# Patient Record
Sex: Male | Born: 1985 | Race: Black or African American | Hispanic: No | Marital: Single | Smoking: Current every day smoker
Health system: Southern US, Community
[De-identification: ages and names within clinical notes are randomized; demographics above are authoritative.]

## PROBLEM LIST (undated history)

## (undated) DIAGNOSIS — J45909 Unspecified asthma, uncomplicated: Secondary | ICD-10-CM

## (undated) HISTORY — PX: SKIN GRAFT: SHX250

---

## 2016-04-10 ENCOUNTER — Inpatient Hospital Stay
Admit: 2016-04-10 | Discharge: 2016-04-10 | Disposition: A | Discharge status: Home / Self Care | Payer: Self-pay | Attending: Emergency Medicine

## 2016-04-10 DIAGNOSIS — S0181XA Laceration without foreign body of other part of head, initial encounter: Secondary | ICD-10-CM

## 2016-04-10 MED ORDER — BACITRACIN 500 UNIT/G TOPICAL PACKET
500 unit/gram | Freq: Once | CUTANEOUS | Status: AC
Start: 2016-04-10 — End: 2016-04-10
  Administered 2016-04-10: 07:00:00 via TOPICAL

## 2016-04-10 MED ORDER — ACETAMINOPHEN 500 MG TAB
500 mg | ORAL_TABLET | Freq: Four times a day (QID) | ORAL | 0 refills | Status: AC | PRN
Start: 2016-04-10 — End: ?

## 2016-04-10 MED ORDER — ACETAMINOPHEN 500 MG TAB
500 mg | ORAL | Status: AC
Start: 2016-04-10 — End: 2016-04-10
  Administered 2016-04-10: 07:00:00 via ORAL

## 2016-04-10 MED ORDER — BACITRACIN 500 UNIT/G OINTMENT
500 unit/gram | Freq: Three times a day (TID) | CUTANEOUS | 0 refills | Status: AC
Start: 2016-04-10 — End: 2016-04-20

## 2016-04-10 MED FILL — TYLENOL EXTRA STRENGTH 500 MG TABLET: 500 mg | ORAL | Qty: 2

## 2016-04-10 MED FILL — BACITRACIN 500 UNIT/G TOPICAL PACKET: 500 unit/gram | CUTANEOUS | Qty: 1

## 2016-04-10 NOTE — ED Notes (Signed)
Patient reports obtaining a head laceration to his right eyebrow by opening a door outwardly while in the dark, bleeding is controlled at this time. 4x4 gauze placed on wound

## 2016-04-10 NOTE — ED Provider Notes (Addendum)
HPI Comments: Patient presents after sustaining a facial injury.  He was running up the stairs, mis-estimated and struck his face on the door.  Denies any loss consciousness does not take any blood thinners, no alcohol but does smoke marijuana.  Denies any visual disturbances reports that his visual acuity is 20/20.  He denies any diplopia.  No nasal pain no dental pain      Patient is a 30 y.o. male presenting with skin laceration.   Laceration           History reviewed. No pertinent past medical history.    History reviewed. No pertinent surgical history.      History reviewed. No pertinent family history.    Social History     Social History   ??? Marital status: N/A     Spouse name: N/A   ??? Number of children: N/A   ??? Years of education: N/A     Occupational History   ??? Not on file.     Social History Main Topics   ??? Smoking status: Current Every Day Smoker   ??? Smokeless tobacco: Not on file   ??? Alcohol use 4.2 oz/week     7 Cans of beer per week   ??? Drug use: Yes     Special: Marijuana   ??? Sexual activity: Not on file     Other Topics Concern   ??? Not on file     Social History Narrative   ??? No narrative on file         ALLERGIES: Review of patient's allergies indicates no known allergies.    Review of Systems   Constitutional: Negative for fever.   Skin: Positive for wound.       Vitals:    04/10/16 0212   BP: (!) 136/93   Pulse: 77   Resp: 20   Temp: 97.5 ??F (36.4 ??C)   SpO2: 98%   Weight: 74.8 kg (165 lb)   Height:  (1.854 m)            Physical Exam   Constitutional:   General:  Well-developed, well-nourished, no apparent distress.    Head: Normocephalic, 2 cm laceration vertical going down to the RIGHT eyebrow, no depressed skull fracture.    Eyes:  Pupils 4 mm  extraocular movements intact.  No pallor or conjunctival injection.    Nose:  No rhinorrhea, inspection grossly normal.    Ears:  Grossly normal to inspection, no discharge.    Mouth:  Mucous membranes moist, no appreciable intraoral lesion.     Neck:  Trachea midline, no asymmetry.    Chest:  Grossly normal inspection, symmetric chest rise.    Extremities:  Grossly normal to inspection, peripheral pulses intact.    Neurologic:  Alert and oriented no appreciable focal neurologic deficit.    Psychiatric:  Grossly normal mood and affect.    Nursing note reviewed, vital signs reviewed.        MDM  Number of Diagnoses or Management Options  Elevated blood pressure reading:   Facial laceration, initial encounter:   Diagnosis management comments: ED course:  Patient presents with facial laceration, no concern for intracranial hemorrhage or facial fracture.  We'll perform primary repair here    Procedure note:  Laceration repair  Performed by: Myself  Preparation: Irrigation and standard sterile precautions  Location: Face  Wound length: 2 cm  Anesthesia:  Local injection of lidocaine  Foreign bodies: None  Irrigation via saline jet  lavage  Debridement: None  Skin closure: 6-0 nylon running  Number of sutures: 1  M.D. procedure time: 10 minutes    Patient's presentation, history, physical exam and laboratory evaluations were reviewed.  At this time patient was felt to be stable for outpatient management and follow with primary care/specialist.  Patient was instructed to return to the emergency department with any concerns.    Disposition:    Discharged home      Portions of this chart were created with Dragon medical speech to text program.   Unrecognized errors may be present.    ED Course       Procedures

## 2016-04-10 NOTE — ED Notes (Signed)
Discharge to home WDL. Patient armband removed and shredded

## 2018-05-11 ENCOUNTER — Emergency Department: Payer: Self-pay

## 2018-05-11 ENCOUNTER — Emergency Department
Admission: EM | Admit: 2018-05-11 | Discharge: 2018-05-12 | Disposition: A | Discharge status: Home / Self Care | Payer: Self-pay | Attending: Emergency Medicine | Admitting: Emergency Medicine

## 2018-05-11 ENCOUNTER — Encounter: Payer: Self-pay | Admitting: Emergency Medicine

## 2018-05-11 ENCOUNTER — Other Ambulatory Visit: Payer: Self-pay

## 2018-05-11 DIAGNOSIS — N50812 Left testicular pain: Secondary | ICD-10-CM

## 2018-05-11 DIAGNOSIS — R109 Unspecified abdominal pain: Secondary | ICD-10-CM

## 2018-05-11 DIAGNOSIS — J45909 Unspecified asthma, uncomplicated: Secondary | ICD-10-CM | POA: Insufficient documentation

## 2018-05-11 DIAGNOSIS — N453 Epididymo-orchitis: Secondary | ICD-10-CM | POA: Insufficient documentation

## 2018-05-11 DIAGNOSIS — R1032 Left lower quadrant pain: Secondary | ICD-10-CM | POA: Insufficient documentation

## 2018-05-11 DIAGNOSIS — N5089 Other specified disorders of the male genital organs: Secondary | ICD-10-CM

## 2018-05-11 DIAGNOSIS — F17200 Nicotine dependence, unspecified, uncomplicated: Secondary | ICD-10-CM | POA: Insufficient documentation

## 2018-05-11 HISTORY — DX: Unspecified asthma, uncomplicated: J45.909

## 2018-05-11 LAB — URINALYSIS, COMPLETE (UACMP) WITH MICROSCOPIC
BACTERIA UA: NONE SEEN
BILIRUBIN URINE: NEGATIVE
Glucose, UA: NEGATIVE mg/dL
KETONES UR: NEGATIVE mg/dL
Nitrite: NEGATIVE
PH: 5 (ref 5.0–8.0)
PROTEIN: 100 mg/dL — AB
Specific Gravity, Urine: 1.025 (ref 1.005–1.030)

## 2018-05-11 LAB — COMPREHENSIVE METABOLIC PANEL
ALT: 9 U/L — AB (ref 17–63)
AST: 14 U/L — ABNORMAL LOW (ref 15–41)
Albumin: 4.2 g/dL (ref 3.5–5.0)
Alkaline Phosphatase: 91 U/L (ref 38–126)
Anion gap: 9 (ref 5–15)
BUN: 7 mg/dL (ref 6–20)
CHLORIDE: 101 mmol/L (ref 101–111)
CO2: 26 mmol/L (ref 22–32)
CREATININE: 0.91 mg/dL (ref 0.61–1.24)
Calcium: 9.3 mg/dL (ref 8.9–10.3)
Glucose, Bld: 123 mg/dL — ABNORMAL HIGH (ref 65–99)
POTASSIUM: 4.2 mmol/L (ref 3.5–5.1)
SODIUM: 136 mmol/L (ref 135–145)
Total Bilirubin: 0.6 mg/dL (ref 0.3–1.2)
Total Protein: 8.4 g/dL — ABNORMAL HIGH (ref 6.5–8.1)

## 2018-05-11 LAB — CBC
HCT: 43.8 % (ref 40.0–52.0)
HEMOGLOBIN: 14.8 g/dL (ref 13.0–18.0)
MCH: 29 pg (ref 26.0–34.0)
MCHC: 33.9 g/dL (ref 32.0–36.0)
MCV: 85.6 fL (ref 80.0–100.0)
Platelets: 188 10*3/uL (ref 150–440)
RBC: 5.11 MIL/uL (ref 4.40–5.90)
RDW: 15.4 % — ABNORMAL HIGH (ref 11.5–14.5)
WBC: 30.3 10*3/uL — AB (ref 3.8–10.6)

## 2018-05-11 LAB — LIPASE, BLOOD: LIPASE: 26 U/L (ref 11–51)

## 2018-05-11 MED ORDER — IOPAMIDOL (ISOVUE-300) INJECTION 61%
30.0000 mL | Freq: Once | INTRAVENOUS | Status: AC
  Administered 2018-05-11: 30 mL via ORAL

## 2018-05-11 MED ORDER — IOHEXOL 300 MG/ML  SOLN
100.0000 mL | Freq: Once | INTRAMUSCULAR | Status: AC | PRN
  Administered 2018-05-11: 100 mL via INTRAVENOUS

## 2018-05-11 NOTE — ED Provider Notes (Signed)
[facility] Emergency Department Provider Note   ____________________________________________   First MD Initiated Contact with Patient 05/11/18 2109     (approximate)  I have reviewed the triage vital signs and the nursing notes.   HISTORY  Chief Complaint Abdominal Pain    HPI Franklin Arellano is a 32 y.o. male complains of 3 days of left lower quadrant pain seems to be getting worse.  Is worse with movement.  He also has swelling in his left testicle which is tender.  This came on about the same time.   Past Medical History:  Diagnosis Date  . Asthma     There are no active problems to display for this patient.   Past Surgical History:  Procedure Laterality Date  . SKIN GRAFT      Prior to Admission medications   Not on File    Allergies Patient has no known allergies.  No family history on file.  Social History Social History   Tobacco Use  . Smoking status: Current Every Day Smoker  . Smokeless tobacco: Never Used  Substance Use Topics  . Alcohol use: Not on file  . Drug use: Not on file    Review of Systems  Constitutional: No fever/chills Eyes: No visual changes. ENT: No sore throat. Cardiovascular: Denies chest pain. Respiratory: Denies shortness of breath. Gastrointestinal: Left lower quadrant abdominal pain.  No nausea, no vomiting.  No diarrhea.  No constipation. Genitourinary: Negative for dysuria. Musculoskeletal: Negative for back pain. Skin: Negative for rash. Neurological: Negative for headaches, focal weakness   ____________________________________________   PHYSICAL EXAM:  VITAL SIGNS: ED Triage Vitals  Enc Vitals Group     BP 05/11/18 2023 (!) 128/100     Pulse Rate 05/11/18 2023 68     Resp 05/11/18 2023 18     Temp 05/11/18 2023 98.5 F (36.9 C)     Temp Source 05/11/18 2023 Oral     SpO2 05/11/18 2023 99 %     Weight 05/11/18 2022 160 lb (72.6 kg)     Height 05/11/18 2022 6\' 1"  (1.854  m)     Head Circumference --      Peak Flow --      Pain Score 05/11/18 2021 10     Pain Loc --      Pain Edu? --      Excl. in GC? --     Constitutional: Alert and oriented. Well appearing and in no acute distress. Eyes: Conjunctivae are normal. Head: Atraumatic. Nose: No congestion/rhinnorhea. Mouth/Throat: Mucous membranes are moist.  Oropharynx non-erythematous. Neck: No stridor.   Cardiovascular: Normal rate, regular rhythm. Grossly normal heart sounds.  Good peripheral circulation. Respiratory: Normal respiratory effort.  No retractions. Lungs CTAB. Gastrointestinal: Soft nontender except to palpation and percussion of the left lower quadrant. No distention. No abdominal bruits. No CVA tenderness. Genitourinary: Normal male genitalia.  Left testicle is swollen and tender. Musculoskeletal: No lower extremity tenderness nor edema.  No joint effusions. Neurologic:  Normal speech and language. No gross focal neurologic deficits are appreciated. No gait instability. Skin:  Skin is warm, dry and intact. No rash noted. Psychiatric: Mood and affect are normal. Speech and behavior are normal.  ____________________________________________   LABS (all labs ordered are listed, but only abnormal results are displayed)  Labs Reviewed  COMPREHENSIVE METABOLIC PANEL - Abnormal; Notable for the following components:      Result Value   Glucose, Bld 123 (*)    Total Protein  8.4 (*)    AST 14 (*)    ALT 9 (*)    All other components within normal limits  CBC - Abnormal; Notable for the following components:   WBC 30.3 (*)    RDW 15.4 (*)    All other components within normal limits  URINALYSIS, COMPLETE (UACMP) WITH MICROSCOPIC - Abnormal; Notable for the following components:   Color, Urine YELLOW (*)    APPearance CLEAR (*)    Hgb urine dipstick SMALL (*)    Protein, ur 100 (*)    Leukocytes, UA SMALL (*)    All other components within normal limits  CHLAMYDIA/NGC RT PCR ([facility]  ONLY)  LIPASE, BLOOD   ____________________________________________  EKG  _______________________________________  RADIOLOGY  ED MD interpretation:    Official radiology report(s): US Scrotum W/doppler  Result Date: 05/11/2018 CLINICAL DATA:  Initial evaluation for acute left testicular pain EXAM: SCROTAL ULTRASOUND DOPPLER ULTRASOUND OF THE TESTICLES TECHNIQUE: Complete ultrasound examination of the testicles, epididymis, and other scrotal structures was performed. Color and spectral Doppler ultrasound were also utilized to evaluate blood flow to the testicles. COMPARISON:  None. FINDINGS: Right testicle Measurements: 5.1 x 2.9 x 2.7 cm. No mass or microlithiasis visualized. Left testicle Measurements: 5.0 x 3.3 x 3.5 cm. No mass or microlithiasis visualized. Left testicle demonstrates a subtly heterogeneous echotexture with diffusely increased vascularity. Right epididymis:  Normal in size and appearance. Left epididymis:  Left epididymis is enlarged and hypervascular. Hydrocele: Complex left-sided hydrocele containing internal septations and debris. Varicocele:  None visualized. Pulsed Doppler interrogation of both testes demonstrates normal low resistance arterial and venous waveforms bilaterally. IMPRESSION: 1. Findings highly suggestive of acute left epididymo-orchitis. 2. Associated mildly complex left-sided hydrocele. 3. No evidence for torsion or other acute abnormality. Electronically Signed   By: Rise Mu M.D.   On: 05/11/2018 22:42    ____________________________________________   PROCEDURES  Procedure(s) performed:   Procedures  Critical Care performed:   ____________________________________________   INITIAL IMPRESSION / ASSESSMENT AND PLAN / ED COURSE   Clinical Course as of May 11 2309  Wed May 11, 2018  2109 Calcium: 9.3 [PM]    Clinical Course User Index [PM] Arnaldo Natal, MD  patient signed out to Dr  Manson Passey   ____________________________________________   FINAL CLINICAL IMPRESSION(S) / ED DIAGNOSES  Final diagnoses:  Abdominal pain, unspecified abdominal location     ED Discharge Orders    None       Note:  This document was prepared using Dragon voice recognition software and may include unintentional dictation errors.    Arnaldo Natal, MD 05/11/18 [id number]

## 2018-05-11 NOTE — ED Notes (Addendum)
Patient transported to US 

## 2018-05-11 NOTE — ED Triage Notes (Signed)
Patient ambulatory to triage with steady gait, without difficulty or distress noted; pt reports x 3 days having left lower abd pain with no accomp symptoms; denies hx of same

## 2018-05-11 NOTE — ED Notes (Signed)
Patient transported to CT 

## 2018-05-12 LAB — CHLAMYDIA/NGC RT PCR (ARMC ONLY)
CHLAMYDIA TR: NOT DETECTED
N GONORRHOEAE: NOT DETECTED

## 2018-05-12 MED ORDER — AZITHROMYCIN 500 MG PO TABS
1000.0000 mg | ORAL_TABLET | Freq: Once | ORAL | Status: AC
  Administered 2018-05-12: 1000 mg via ORAL
  Filled 2018-05-12: qty 2

## 2018-05-12 MED ORDER — LIDOCAINE HCL (PF) 1 % IJ SOLN
0.9000 mL | Freq: Once | INTRAMUSCULAR | Status: AC
  Administered 2018-05-12: 0.9 mL via INTRADERMAL

## 2018-05-12 MED ORDER — CEFTRIAXONE SODIUM 250 MG IJ SOLR
250.0000 mg | Freq: Once | INTRAMUSCULAR | Status: AC
  Administered 2018-05-12: 250 mg via INTRAMUSCULAR
  Filled 2018-05-12: qty 250

## 2018-05-12 MED ORDER — LEVOFLOXACIN 500 MG PO TABS: 500.0000 mg | ORAL_TABLET | Freq: Every day | ORAL | 0 refills | Status: AC

## 2018-05-12 MED ORDER — LIDOCAINE HCL (PF) 1 % IJ SOLN
INTRAMUSCULAR | Status: AC
  Administered 2018-05-12: 0.9 mL via INTRADERMAL
  Filled 2018-05-12: qty 5

## 2018-05-12 NOTE — ED Provider Notes (Signed)
I assumed care of the patient from Dr. Juliette AlcideMelinda with recommendation to follow-up on CT abdomen revealed circumferential thickening of the urinary bladder concerning for cystitis.  In addition ultrasound revealed that the patient had epididymoorchitis.  Patient requested to leave before gonorrhea chlamydia studies were reported and as such patient was given ceftriaxone 2 and 50 mg IM azithromycin 1 g.  Given possibility that this is E. coli related patient will also be given Levaquin 500 mg daily x10 days.   Darci CurrentBrown, [place] N, MD 05/12/18 [phone]

## 2018-06-29 IMAGING — US US SCROTUM W/ DOPPLER COMPLETE
1 series · 13 of 25 positions shown · non-contrast
Comparison: None.

CLINICAL DATA: Initial evaluation for acute left testicular pain

EXAM:
SCROTAL ULTRASOUND
DOPPLER ULTRASOUND OF THE TESTICLES
TECHNIQUE: Complete ultrasound examination of the testicles, epididymis, and
other scrotal structures was performed. Color and spectral Doppler
ultrasound were also utilized to evaluate blood flow to the
testicles.

[Series 1: us scrotum w/ doppler complete · 0.08mm/px · 13 of 60 slices shown]
[im 1/60]
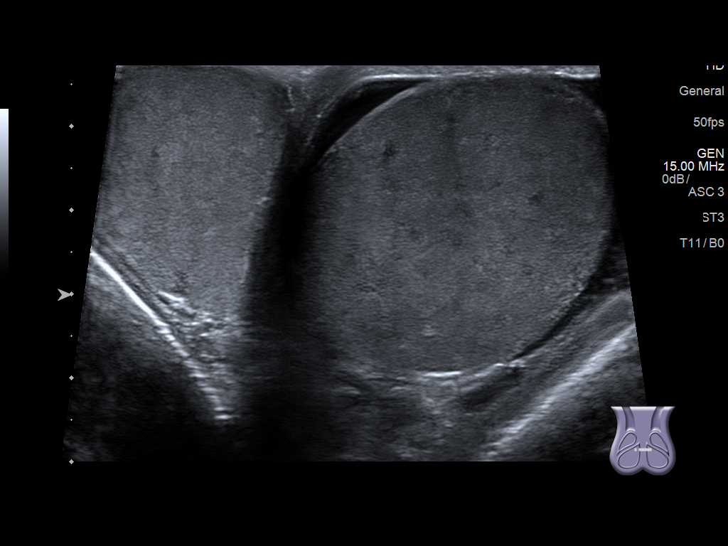
[im 5/60]
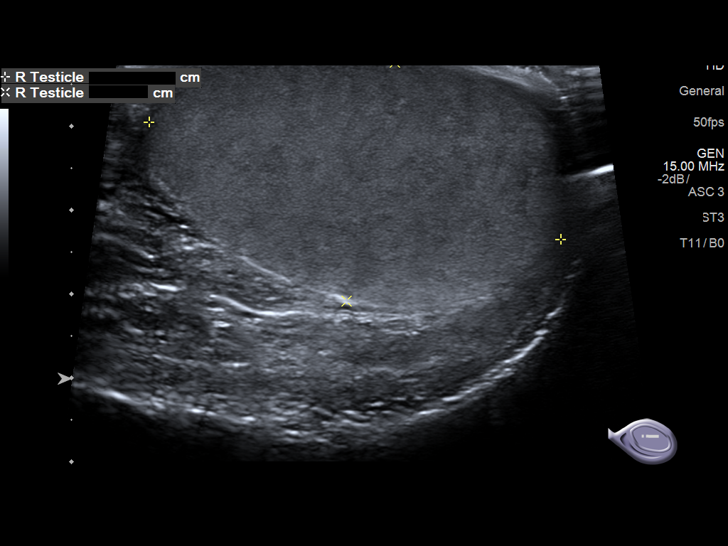
[im 10/60]
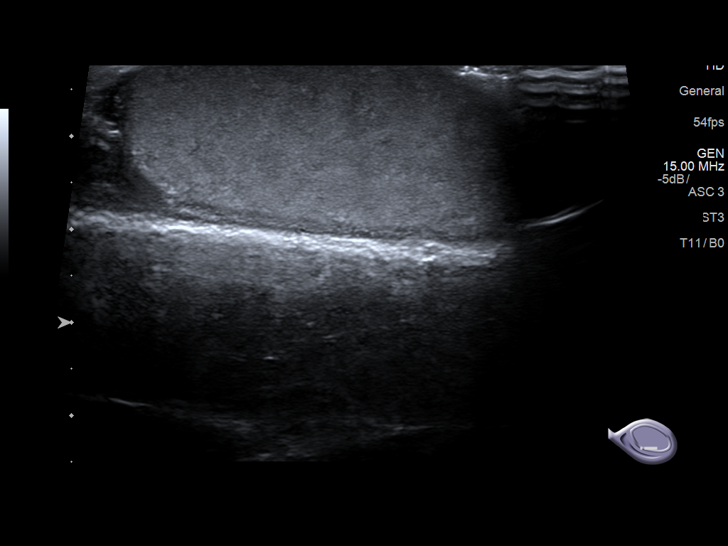
[im 15/60]
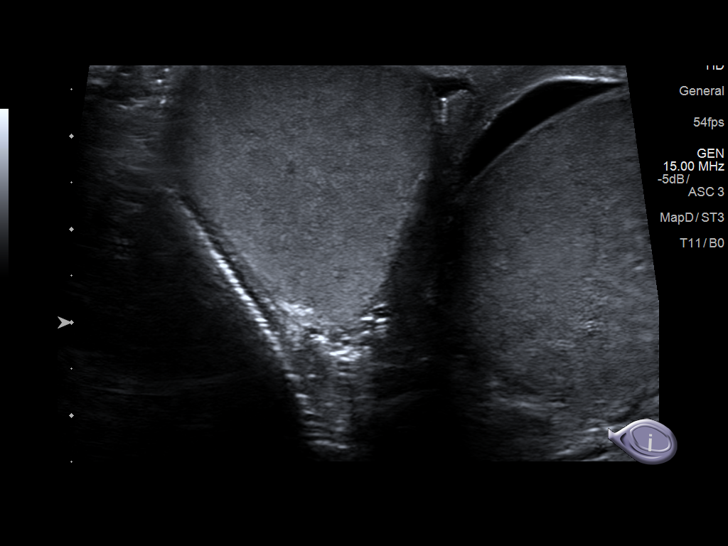
[im 20/60]
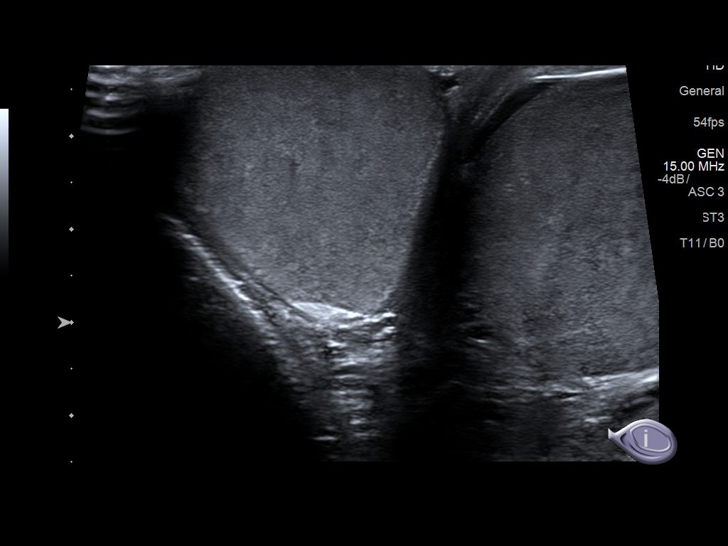
[im 25/60]
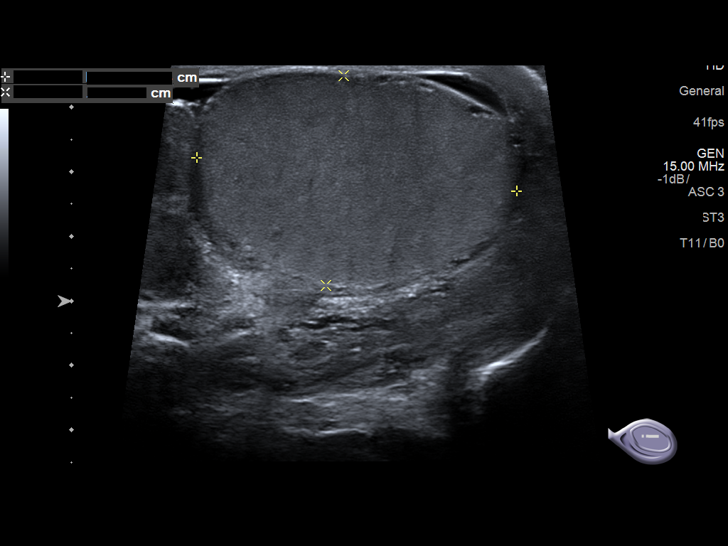
[im 30/60]
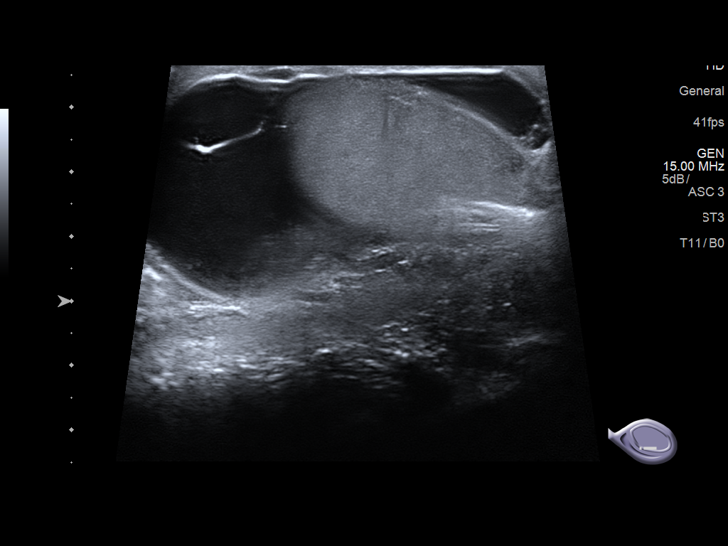
[im 35/60]
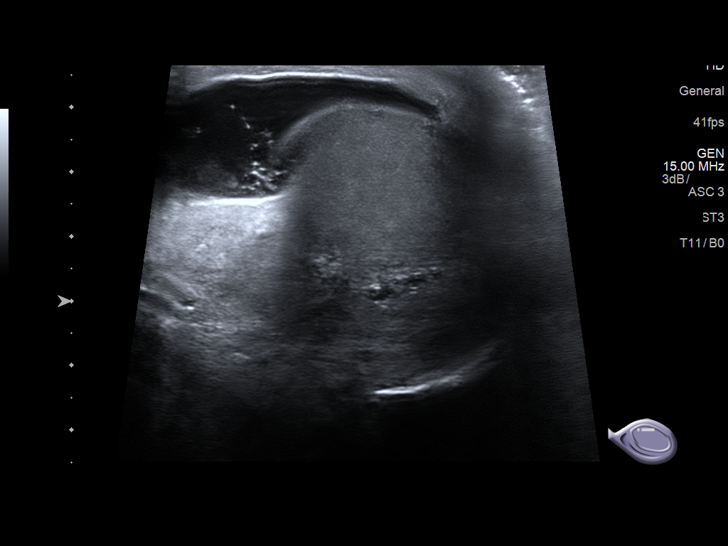
[im 40/60]
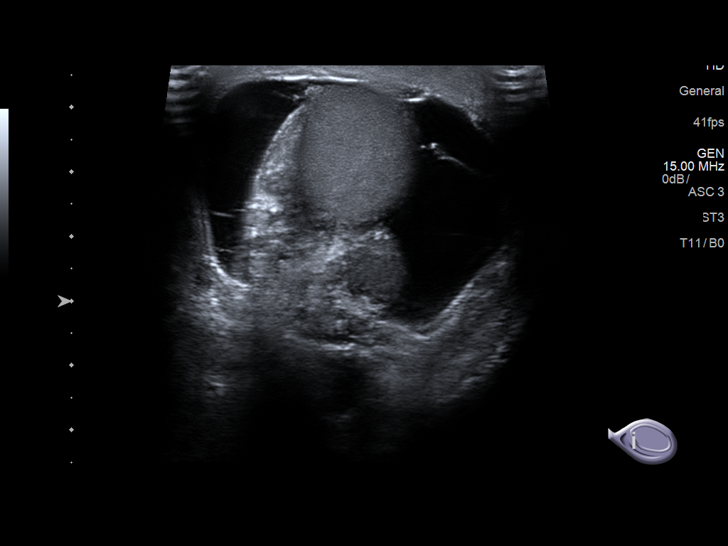
[im 45/60]
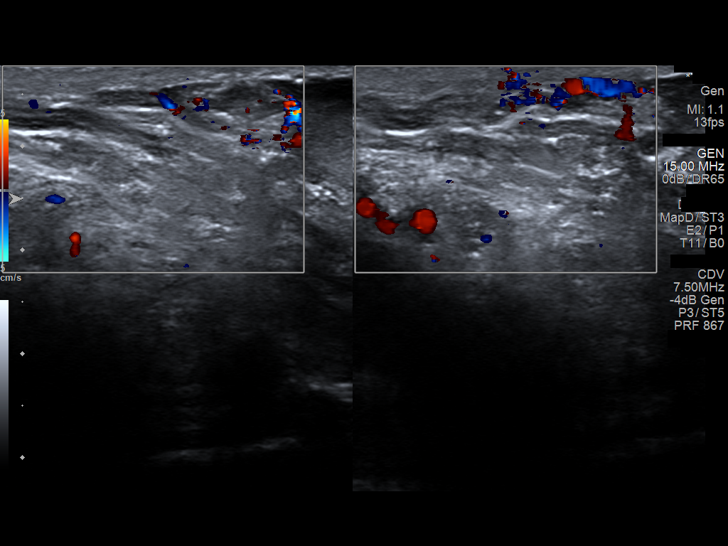
[im 50/60]
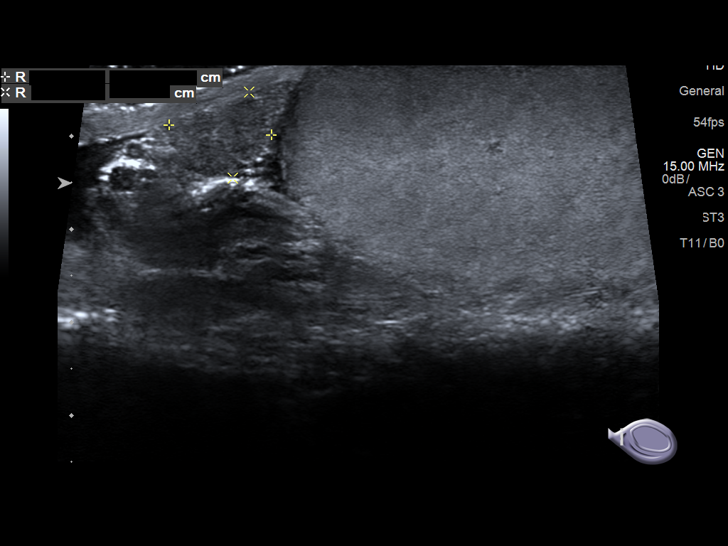
[im 55/60]
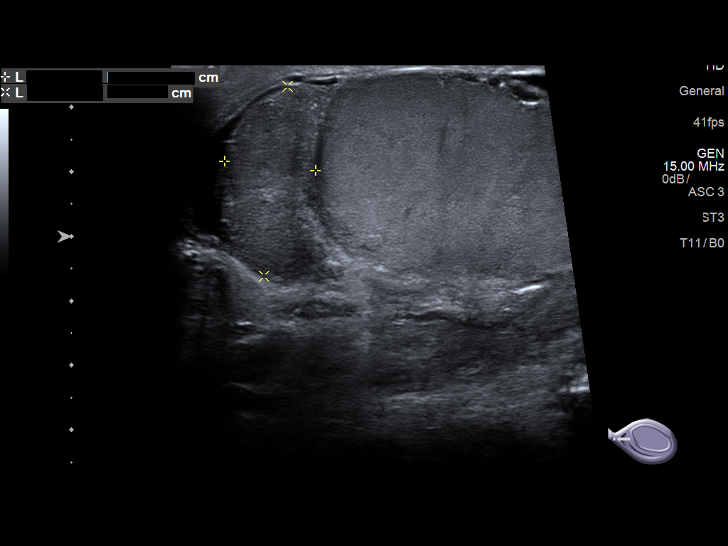
[im 60/60]
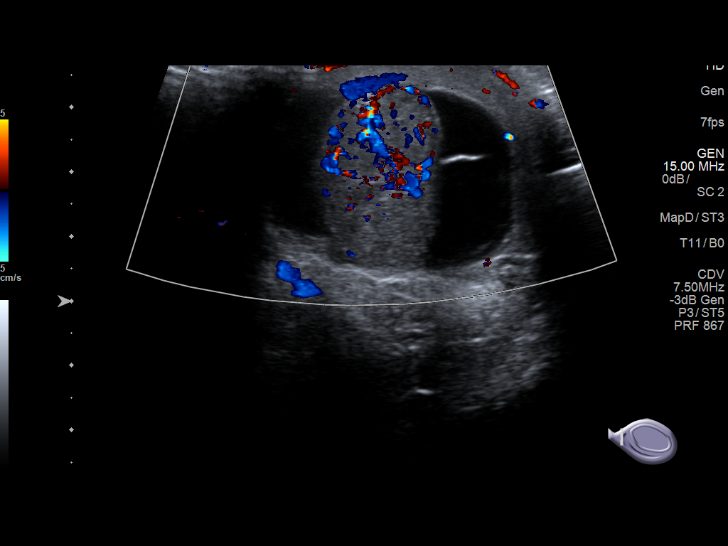

[13 of 25 positions shown; findings below may reference images not displayed]

FINDINGS: Right testicle

Measurements: 5.1 x 2.9 x 2.7 cm. No mass or microlithiasis
visualized.

Left testicle

Measurements: 5.0 x 3.3 x 3.5 cm. No mass or microlithiasis
visualized. Left testicle demonstrates a subtly heterogeneous
echotexture with diffusely increased vascularity.

Right epididymis:  Normal in size and appearance.

Left epididymis:  Left epididymis is enlarged and hypervascular.

Hydrocele: Complex left-sided hydrocele containing internal
septations and debris.

Varicocele:  None visualized.

Pulsed Doppler interrogation of both testes demonstrates normal low
resistance arterial and venous waveforms bilaterally.
IMPRESSION: 1. Findings highly suggestive of acute left epididymo-orchitis.
2. Associated mildly complex left-sided hydrocele.
3. No evidence for torsion or other acute abnormality.

## 2019-12-04 IMAGING — CT CT ABD-PELV W/ CM
2 of 7 series · 14 of 46 positions shown, 18 images · IV contrast (APPLIED)
Comparison: None.

CLINICAL DATA: Left lower abdominal pain x3 days.

EXAM:
CT ABDOMEN AND PELVIS WITH CONTRAST
TECHNIQUE: Multidetector CT imaging of the abdomen and pelvis was performed
using the standard protocol following bolus administration of
intravenous contrast.
CONTRAST:  100mL OMNIPAQUE IOHEXOL 300 MG/ML  SOLN

[Series 2: routine abd/pel with · axial · 0.65mm/px · z∈[-505,-145]mm · 11 of 86 slices shown, 15 images]
[im 9/86  soft-tissue]
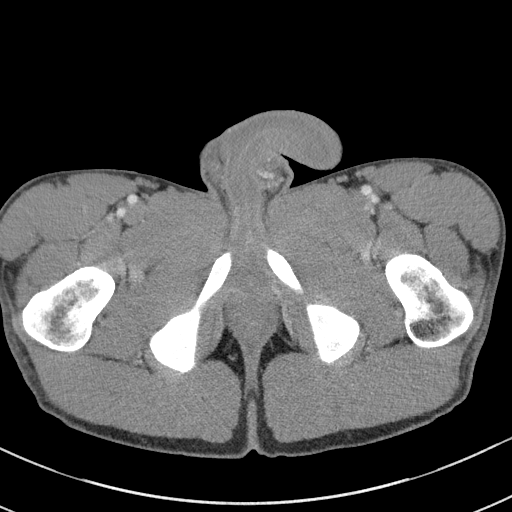
[im 9/86  bone]
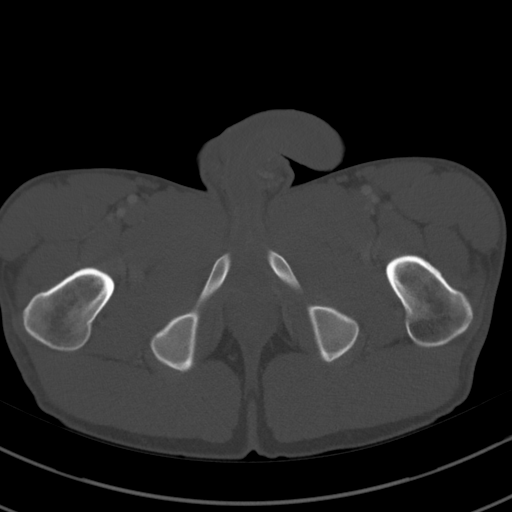
[im 18/86  soft-tissue]
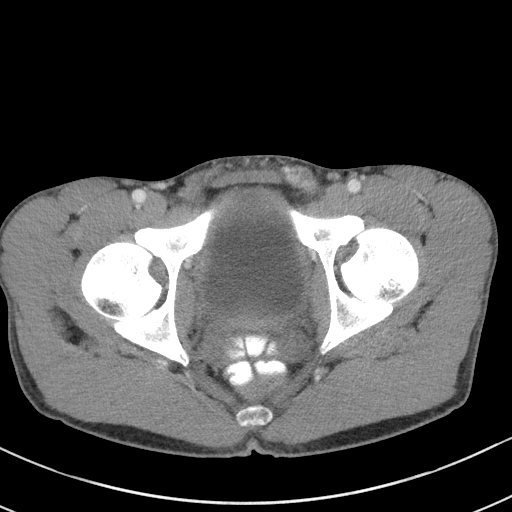
[im 26/86  soft-tissue]
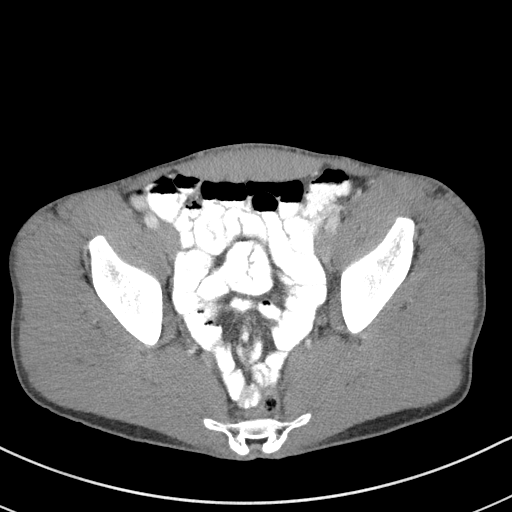
[im 35/86  soft-tissue]
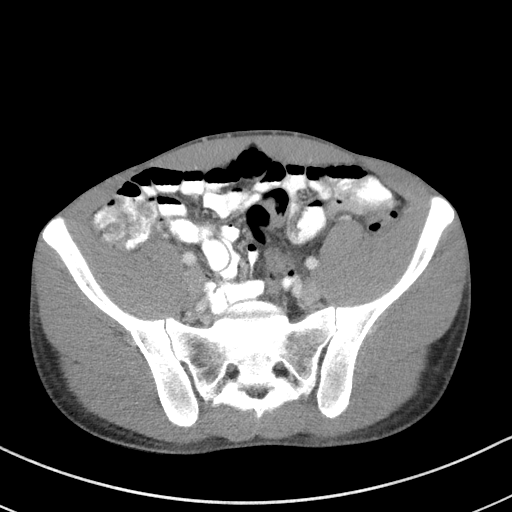
[im 43/86  soft-tissue]
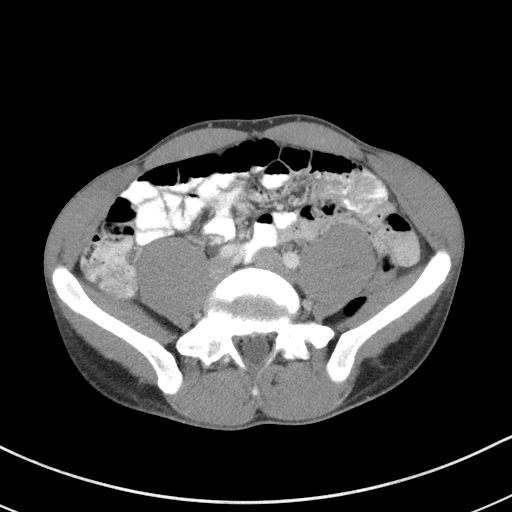
[im 52/86  soft-tissue]
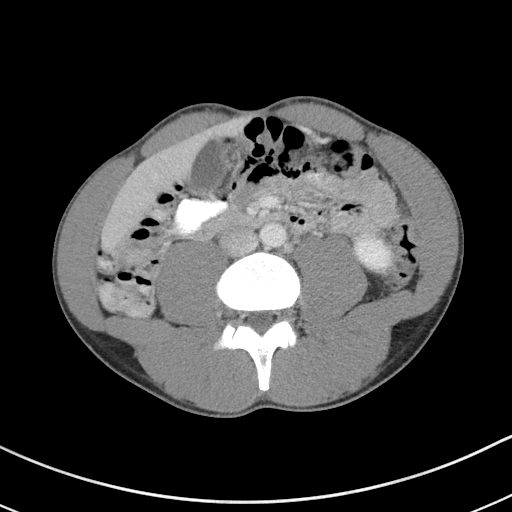
[im 60/86  soft-tissue]
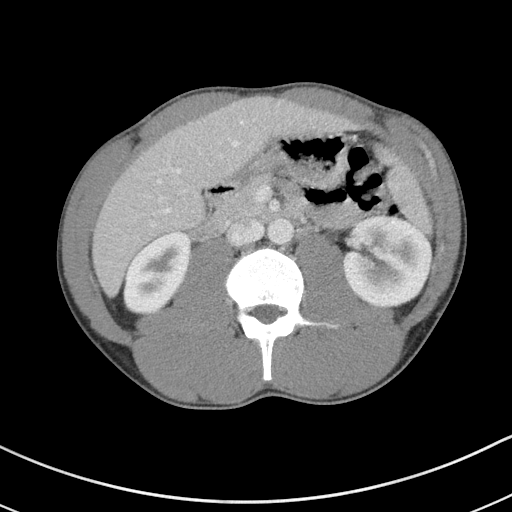
[im 69/86  soft-tissue]
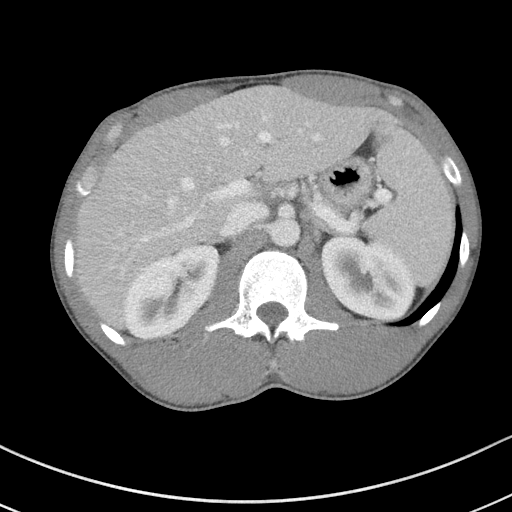
[im 69/86  lung]
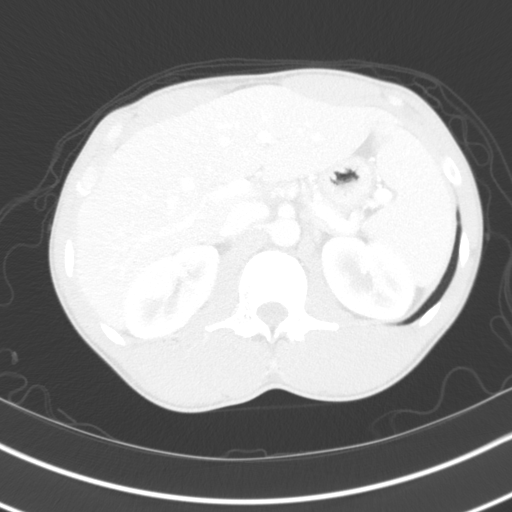
[im 73/86  lung]
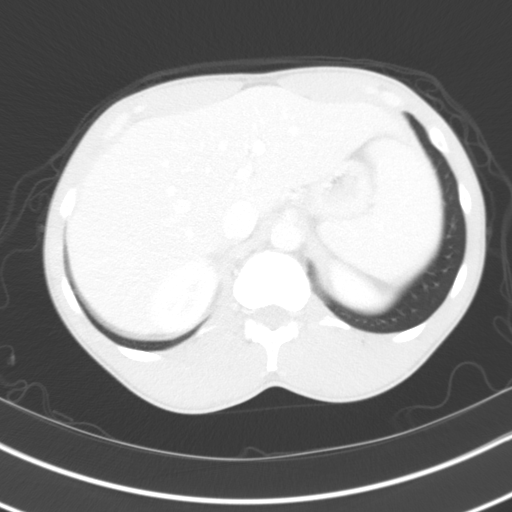
[im 77/86  soft-tissue]
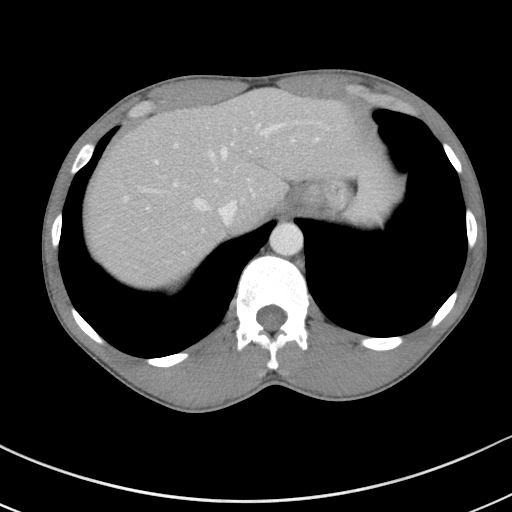
[im 77/86  lung]
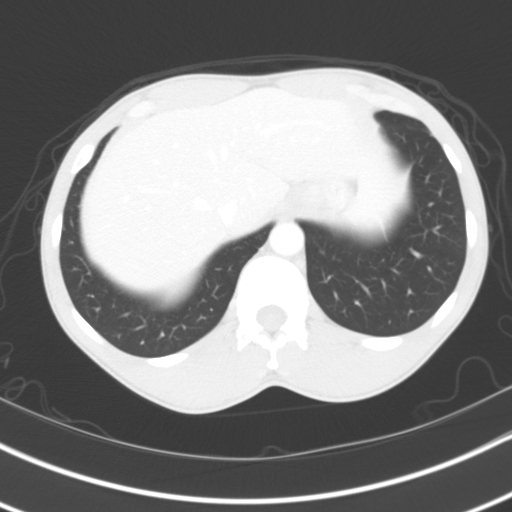
[im 77/86  bone]
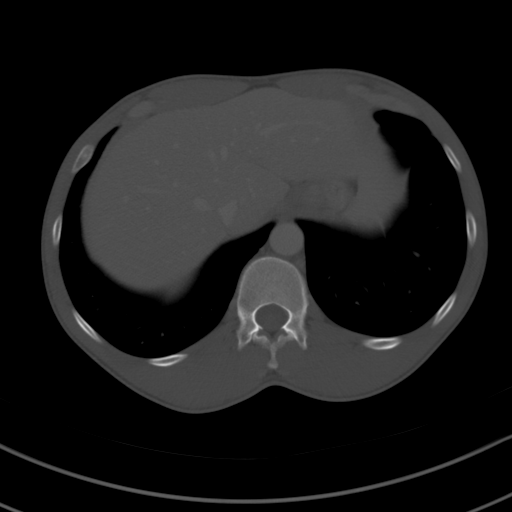
[im 81/86  lung]
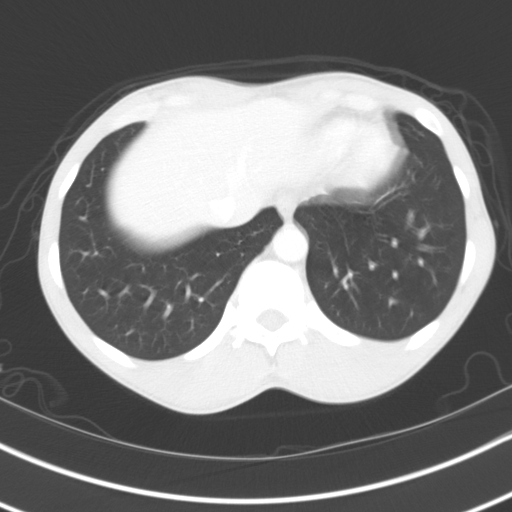

[Series 7: coronal st · coronal · 0.68mm/px · 3 of 84 slices shown]
[im 21/84  soft-tissue]
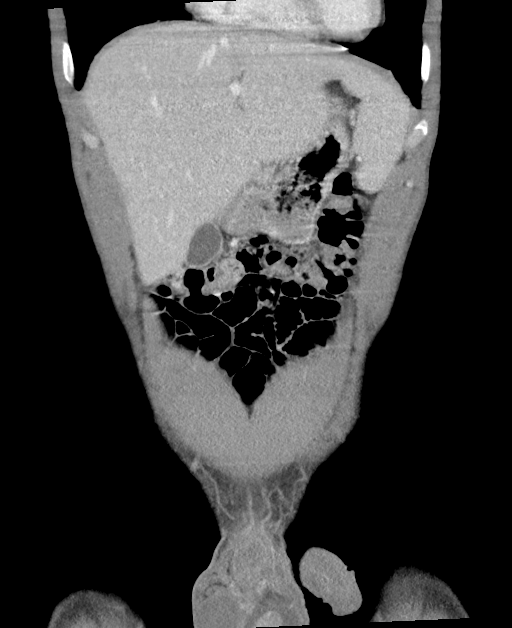
[im 42/84  soft-tissue]
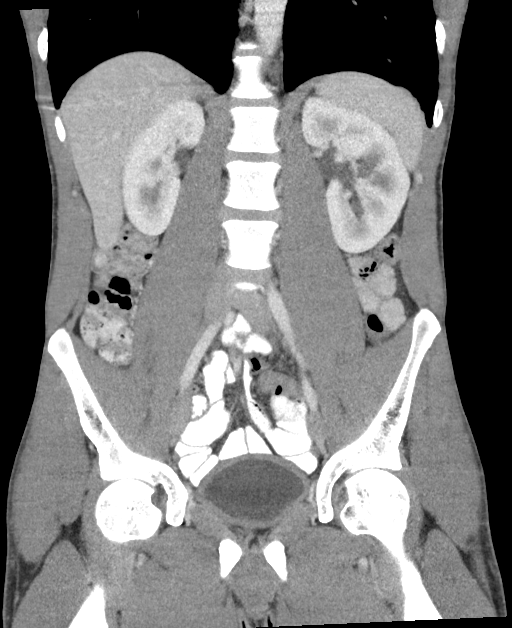
[im 63/84  soft-tissue]
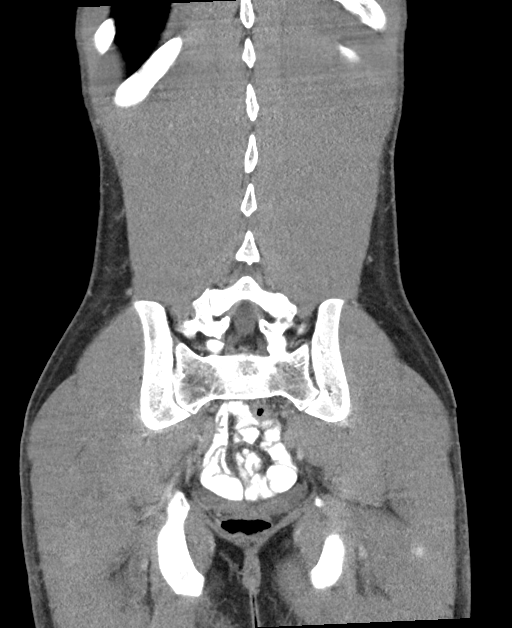

[14 of 46 positions shown; findings below may reference images not displayed]

FINDINGS: LOWER CHEST: Lung bases are clear. Included heart size is normal. No
pericardial effusion.

HEPATOBILIARY: Liver and gallbladder are normal.

PANCREAS: Normal.

SPLEEN: Normal.

ADRENALS/URINARY TRACT: Kidneys are orthotopic, demonstrating
symmetric enhancement. 1 cm upper pole renal cyst too small to
further characterize. No nephrolithiasis, hydronephrosis or solid
renal masses. The urinary bladder appears mildly thickened in
appearance some of which may be due to underdistention but a
cystitis is not excluded. Normal adrenal glands.

STOMACH/BOWEL: The stomach, small and large bowel are normal in
course and caliber without inflammatory changes. Increased colonic
stool burden is noted. No bowel obstruction. Normal appendix.

VASCULAR/LYMPHATIC: Aortoiliac vessels are normal in course and
caliber. No lymphadenopathy by CT size criteria.

REPRODUCTIVE: Normal.

OTHER: No intraperitoneal free fluid or free air.

MUSCULOSKELETAL: Mild broad-based disc bulge at L2-3, moderate at
L4-5 and with disc space narrowing at L5-S1. No acute osseous
abnormality.
IMPRESSION: 1. Mild circumferential thickening of the urinary bladder some which
is likely due to underdistention. Correlate to exclude a cystitis. A
too small to characterize 1 cm hypodensity in the upper pole the
right kidney statistically consistent with a cyst. No
nephrolithiasis.
2. Increased colonic stool burden. No bowel obstruction or
inflammation. Normal appendix.
3. L2-3 and L4-5 disc bulges.
